# Patient Record
Sex: Male | Born: 1950 | Race: White | Hispanic: No | Marital: Married
Health system: Midwestern US, Academic
[De-identification: ages and names within clinical notes are randomized; demographics above are authoritative.]

---

## 2018-06-27 ENCOUNTER — Encounter: Admit: 2018-06-27 | Discharge: 2018-06-28 | Payer: MEDICARE

## 2018-07-03 ENCOUNTER — Encounter: Admit: 2018-07-03 | Discharge: 2018-07-04 | Payer: MEDICARE

## 2018-07-04 ENCOUNTER — Encounter: Admit: 2018-07-04 | Discharge: 2018-07-04 | Payer: MEDICARE

## 2018-07-04 DIAGNOSIS — R918 Other nonspecific abnormal finding of lung field: Principal | ICD-10-CM

## 2018-07-04 MED ORDER — SODIUM CHLORIDE 0.9 % IV SOLP
INTRAVENOUS | 0 refills | Status: CN
Start: 2018-07-04 — End: ?

## 2018-07-10 ENCOUNTER — Ambulatory Visit: Admit: 2018-07-11 | Discharge: 2018-07-11 | Payer: MEDICARE

## 2018-07-10 DIAGNOSIS — R918 Other nonspecific abnormal finding of lung field: Principal | ICD-10-CM

## 2018-07-11 ENCOUNTER — Encounter: Admit: 2018-07-11 | Discharge: 2018-07-11 | Payer: MEDICARE

## 2018-07-11 ENCOUNTER — Ambulatory Visit: Admit: 2018-07-11 | Discharge: 2018-07-11 | Payer: MEDICARE

## 2018-07-11 ENCOUNTER — Ambulatory Visit: Admit: 2018-07-10 | Discharge: 2018-07-10 | Payer: MEDICARE

## 2018-07-11 DIAGNOSIS — E119 Type 2 diabetes mellitus without complications: ICD-10-CM

## 2018-07-11 DIAGNOSIS — Z87891 Personal history of nicotine dependence: ICD-10-CM

## 2018-07-11 DIAGNOSIS — J8410 Pulmonary fibrosis, unspecified: ICD-10-CM

## 2018-07-11 DIAGNOSIS — R918 Other nonspecific abnormal finding of lung field: Principal | ICD-10-CM

## 2018-07-11 DIAGNOSIS — R590 Localized enlarged lymph nodes: ICD-10-CM

## 2018-07-11 DIAGNOSIS — E039 Hypothyroidism, unspecified: ICD-10-CM

## 2018-07-11 DIAGNOSIS — Z880 Allergy status to penicillin: ICD-10-CM

## 2018-07-11 DIAGNOSIS — R0689 Other abnormalities of breathing: ICD-10-CM

## 2018-07-11 DIAGNOSIS — J329 Chronic sinusitis, unspecified: ICD-10-CM

## 2018-07-11 LAB — GRAM STAIN

## 2018-07-11 LAB — LEUKEMIA/LYMPHOMA PANEL FLUID/TISSUE

## 2018-07-11 LAB — COMPREHENSIVE METABOLIC PANEL
Lab: 1 mg/dL (ref 0.3–1.2)
Lab: 1.1 mg/dL (ref 0.4–1.24)
Lab: 101 mg/dL — ABNORMAL HIGH (ref 70–100)
Lab: 103 MMOL/L (ref 98–110)
Lab: 13 mg/dL (ref 7–25)
Lab: 138 MMOL/L (ref 137–147)
Lab: 25 U/L (ref 7–40)
Lab: 26 U/L (ref 7–56)
Lab: 27 MMOL/L (ref 21–30)
Lab: 3.6 MMOL/L (ref 3.5–5.1)
Lab: 4.5 g/dL (ref 3.5–5.0)
Lab: 67 U/L (ref 25–110)
Lab: 7.8 g/dL (ref 6.0–8.0)
Lab: 8 (ref 3–12)
Lab: 9.4 mg/dL (ref 8.5–10.6)
Lab: >60 mL/min (ref >60)
Lab: >60 mL/min (ref >60)

## 2018-07-11 LAB — CBC: Lab: 7.6 10*3/uL (ref 4.5–11.0)

## 2018-07-11 LAB — HERPES SIMPLEX PCR - NON-BLOOD

## 2018-07-11 MED ORDER — FENTANYL CITRATE (PF) 50 MCG/ML IJ SOLN
25 ug | Freq: Once | INTRAVENOUS | 0 refills | Status: DC | PRN
Start: 2018-07-11 — End: 2018-07-11

## 2018-07-11 MED ORDER — SUGAMMADEX 100 MG/ML IV SOLN
INTRAVENOUS | 0 refills | Status: DC
Start: 2018-07-11 — End: 2018-07-11
  Administered 2018-07-11: 16:00:00 200 mg via INTRAVENOUS

## 2018-07-11 MED ORDER — HYDROMORPHONE (PF) 2 MG/ML IJ SYRG
.5 mg | Freq: Once | INTRAVENOUS | 0 refills | Status: DC | PRN
Start: 2018-07-11 — End: 2018-07-11

## 2018-07-11 MED ORDER — FENTANYL CITRATE (PF) 50 MCG/ML IJ SOLN
50 ug | Freq: Once | INTRAVENOUS | 0 refills | Status: DC | PRN
Start: 2018-07-11 — End: 2018-07-11

## 2018-07-11 MED ORDER — DEXAMETHASONE SODIUM PHOSPHATE 4 MG/ML IJ SOLN
INTRAVENOUS | 0 refills | Status: DC
Start: 2018-07-11 — End: 2018-07-11
  Administered 2018-07-11: 15:00:00 4 mg via INTRAVENOUS

## 2018-07-11 MED ORDER — ONDANSETRON HCL (PF) 4 MG/2 ML IJ SOLN
4 mg | Freq: Once | INTRAVENOUS | 0 refills | Status: DC | PRN
Start: 2018-07-11 — End: 2018-07-11

## 2018-07-11 MED ORDER — SODIUM CHLORIDE 0.9 % IV SOLP
INTRAVENOUS | 0 refills | Status: DC
Start: 2018-07-11 — End: 2018-07-11
  Administered 2018-07-11: 13:00:00 1000 mL via INTRAVENOUS

## 2018-07-11 MED ORDER — LIDOCAINE (PF) 200 MG/10 ML (2 %) IJ SYRG
0 refills | Status: DC
Start: 2018-07-11 — End: 2018-07-11
  Administered 2018-07-11: 15:00:00 100 mg via INTRAVENOUS

## 2018-07-11 MED ORDER — DEXTRAN 70-HYPROMELLOSE (PF) 0.1-0.3 % OP DPET
0 refills | Status: DC
Start: 2018-07-11 — End: 2018-07-11
  Administered 2018-07-11: 15:00:00 1 [drp] via OPHTHALMIC

## 2018-07-11 MED ORDER — HALOPERIDOL LACTATE 5 MG/ML IJ SOLN
1 mg | Freq: Once | INTRAVENOUS | 0 refills | Status: DC | PRN
Start: 2018-07-11 — End: 2018-07-11

## 2018-07-11 MED ORDER — PROPOFOL INJ 10 MG/ML IV VIAL
0 refills | Status: DC
Start: 2018-07-11 — End: 2018-07-11
  Administered 2018-07-11: 15:00:00 140 mg via INTRAVENOUS

## 2018-07-11 MED ORDER — FENTANYL CITRATE (PF) 50 MCG/ML IJ SOLN
0 refills | Status: DC
Start: 2018-07-11 — End: 2018-07-11
  Administered 2018-07-11: 15:00:00 25 ug via INTRAVENOUS

## 2018-07-11 MED ORDER — ROCURONIUM 10 MG/ML IV SOLN
INTRAVENOUS | 0 refills | Status: DC
Start: 2018-07-11 — End: 2018-07-11
  Administered 2018-07-11: 15:00:00 40 mg via INTRAVENOUS
  Administered 2018-07-11 (×2): 10 mg via INTRAVENOUS

## 2018-07-11 MED ORDER — ONDANSETRON HCL (PF) 4 MG/2 ML IJ SOLN
INTRAVENOUS | 0 refills | Status: DC
Start: 2018-07-11 — End: 2018-07-11
  Administered 2018-07-11: 16:00:00 4 mg via INTRAVENOUS

## 2018-07-11 MED ORDER — PROMETHAZINE 25 MG/ML IJ SOLN
6.25 mg | INTRAVENOUS | 0 refills | Status: DC | PRN
Start: 2018-07-11 — End: 2018-07-11

## 2018-07-11 MED ORDER — OXYCODONE-ACETAMINOPHEN 5-325 MG PO TAB
1-2 | Freq: Once | ORAL | 0 refills | Status: DC | PRN
Start: 2018-07-11 — End: 2018-07-11

## 2018-07-12 LAB — CELL COUNT W/DIFF-FLUIDS
Lab: 31 % (ref 0–0.80)
Lab: 45 /uL (ref 0–2)
Lab: 53 % (ref 0–0.45)

## 2018-07-13 ENCOUNTER — Encounter: Admit: 2018-07-13 | Discharge: 2018-07-13 | Payer: MEDICARE

## 2018-07-13 DIAGNOSIS — R0689 Other abnormalities of breathing: ICD-10-CM

## 2018-07-13 DIAGNOSIS — E039 Hypothyroidism, unspecified: Principal | ICD-10-CM

## 2018-07-13 DIAGNOSIS — J329 Chronic sinusitis, unspecified: ICD-10-CM

## 2018-07-13 LAB — CULTURE-RESP,LOWER W/SENSITIVITY: Lab: LOW FL (ref 7–11)

## 2018-07-17 ENCOUNTER — Encounter: Admit: 2018-07-17 | Discharge: 2018-07-17 | Payer: MEDICARE

## 2018-07-18 LAB — CMV QUANT PCR-FLUID

## 2018-08-12 LAB — CULTURE-FUNGAL,OTHER

## 2018-08-14 ENCOUNTER — Encounter: Admit: 2018-08-14 | Discharge: 2018-08-14 | Payer: MEDICARE

## 2018-08-26 LAB — CULTURE-TB (AFB)

## 2019-07-31 ENCOUNTER — Encounter: Admit: 2019-07-31 | Discharge: 2019-07-31 | Payer: MEDICARE

## 2019-08-01 ENCOUNTER — Encounter: Admit: 2019-08-01 | Discharge: 2019-08-01 | Payer: MEDICARE

## 2019-08-01 NOTE — Telephone Encounter
Per Dr Hart Rochester, patient will need to be seen by [facility] urgent and can be seen by [facility] - 2 weeks.  Pulm Admin emailed with request to contact patient and schedule.  Trellis Moment, RN

## 2019-08-01 NOTE — Telephone Encounter
Referral received from Dr Earvin Hansen for RLL lung mass.  CT Chest from 07/25/19.  Will discuss with Dr Charlie Pitter.  Trellis Moment, RN

## 2019-08-15 ENCOUNTER — Encounter: Admit: 2019-08-15 | Discharge: 2019-08-15 | Payer: MEDICARE

## 2019-08-15 ENCOUNTER — Ambulatory Visit: Admit: 2019-08-15 | Discharge: 2019-08-15 | Payer: MEDICARE

## 2019-08-15 DIAGNOSIS — E039 Hypothyroidism, unspecified: Secondary | ICD-10-CM

## 2019-08-15 DIAGNOSIS — J219 Acute bronchiolitis, unspecified: Secondary | ICD-10-CM

## 2019-08-15 DIAGNOSIS — J189 Pneumonia, unspecified organism: Secondary | ICD-10-CM

## 2019-08-15 DIAGNOSIS — J329 Chronic sinusitis, unspecified: Secondary | ICD-10-CM

## 2019-08-15 LAB — COMPREHENSIVE METABOLIC PANEL
Lab: 103 MMOL/L (ref 98–110)
Lab: 13 mg/dL — ABNORMAL LOW (ref 7–25)
Lab: 138 MMOL/L (ref 137–147)
Lab: 20 U/L — ABNORMAL HIGH (ref 7–56)
Lab: 3.9 MMOL/L (ref 3.5–5.1)
Lab: 95 mg/dL (ref 70–100)

## 2019-08-15 LAB — CBC AND DIFF
Lab: 0 K/UL — ABNORMAL HIGH (ref 0–0.20)
Lab: 0.1 K/UL (ref >60)
Lab: 0.3 K/UL — ABNORMAL HIGH (ref >60)
Lab: 4.7 M/UL (ref 4.4–5.5)
Lab: 5.2 10*3/uL (ref 4.5–11.0)

## 2019-08-19 ENCOUNTER — Encounter: Admit: 2019-08-19 | Discharge: 2019-08-19 | Payer: MEDICARE

## 2019-08-19 DIAGNOSIS — J189 Pneumonia, unspecified organism: Secondary | ICD-10-CM

## 2019-08-19 DIAGNOSIS — J329 Chronic sinusitis, unspecified: Secondary | ICD-10-CM

## 2019-08-19 DIAGNOSIS — E039 Hypothyroidism, unspecified: Secondary | ICD-10-CM

## 2019-09-29 ENCOUNTER — Encounter: Admit: 2019-09-29 | Discharge: 2019-09-29 | Payer: MEDICARE

## 2019-10-23 ENCOUNTER — Encounter: Admit: 2019-10-23 | Discharge: 2019-10-23 | Payer: MEDICARE

## 2021-05-25 IMAGING — CT CHEST W(Adult)
2 of 4 series · 12 of 36 positions shown, 15 images · IV contrast (Omnipaque)
Comparison: none

[Series 2: thorax ax 5.00 br40 s3 · axial · 0.59mm/px · z∈[+1652,+1957]mm · 9 of 69 slices shown, 12 images]
[im 6/69  mediastinal]
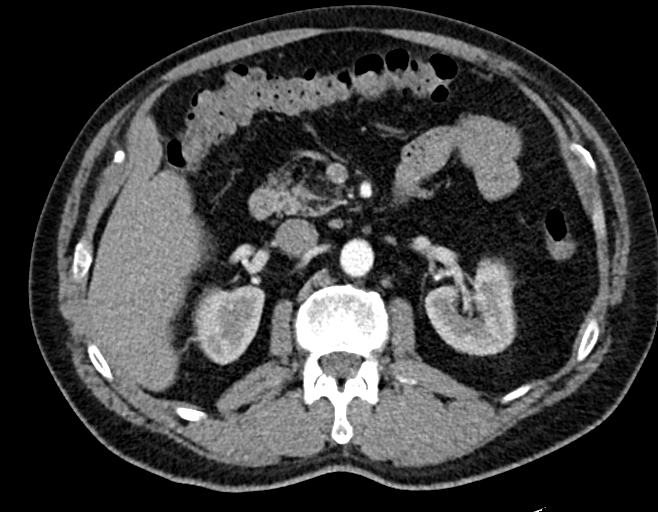
[im 6/69  lung]
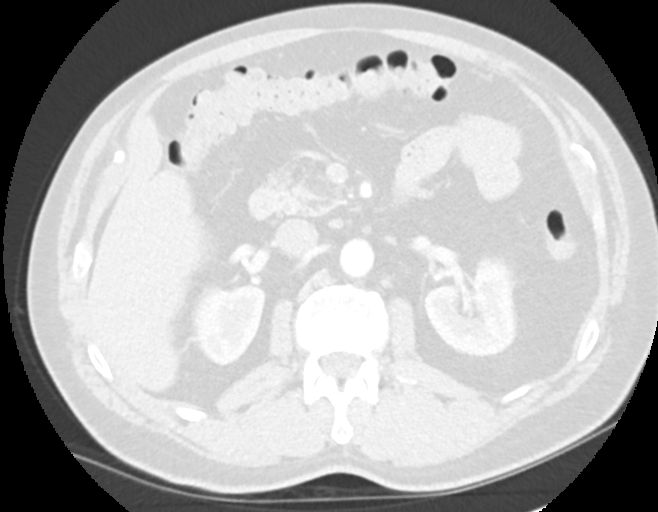
[im 16/69  lung]
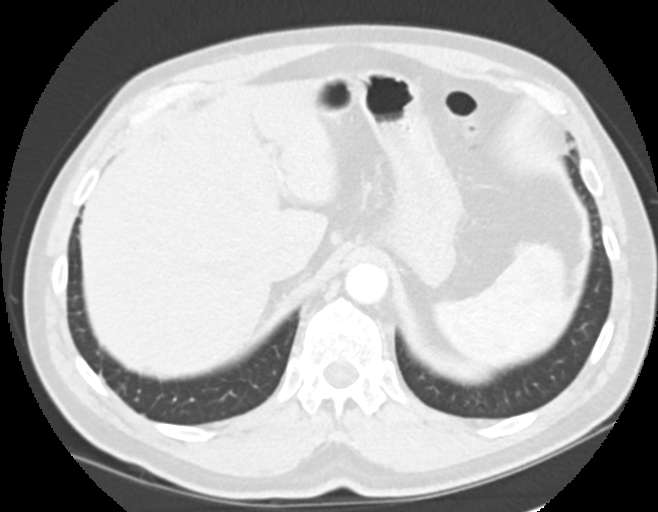
[im 21/69  lung]
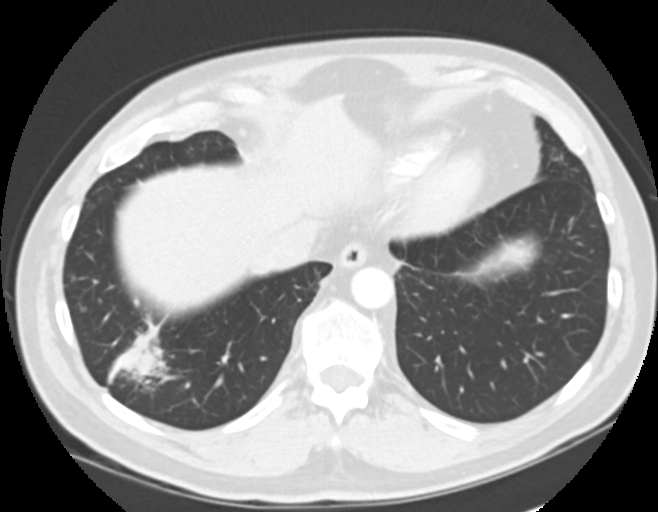
[im 27/69  lung]
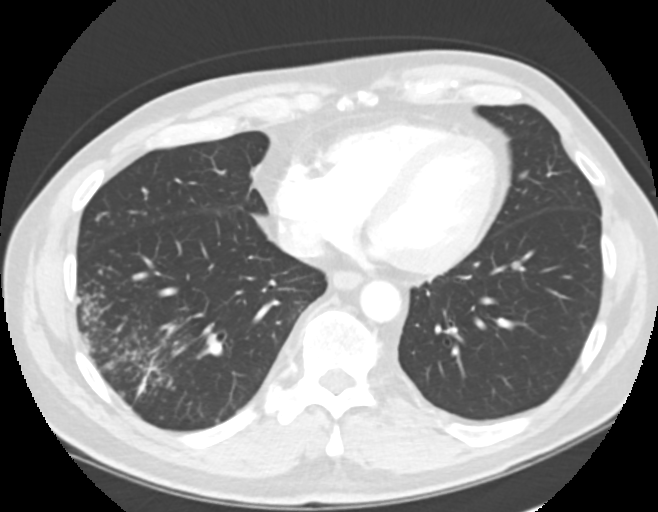
[im 37/69  mediastinal]
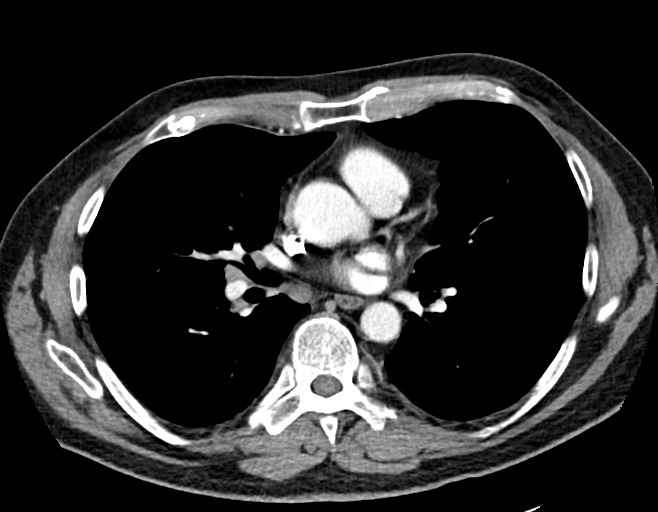
[im 37/69  lung]
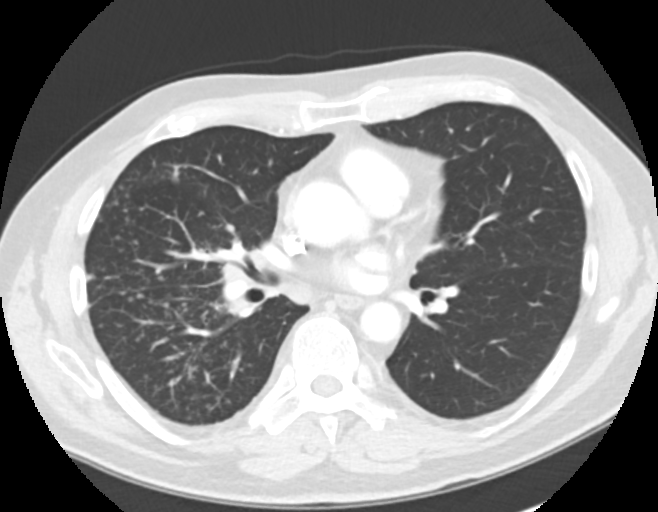
[im 42/69  lung]
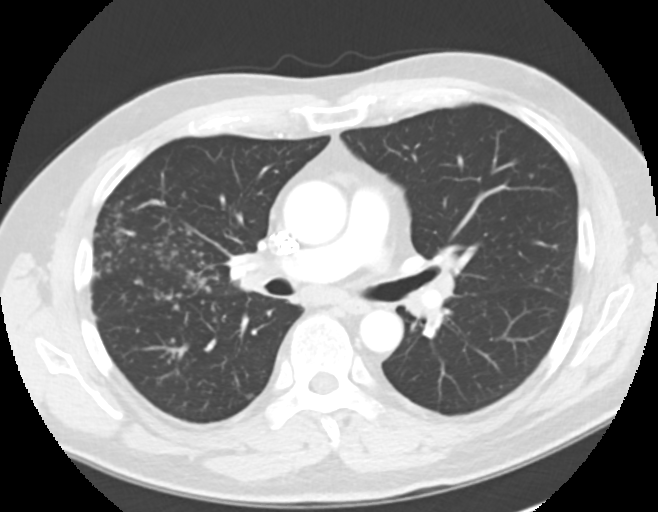
[im 48/69  lung]
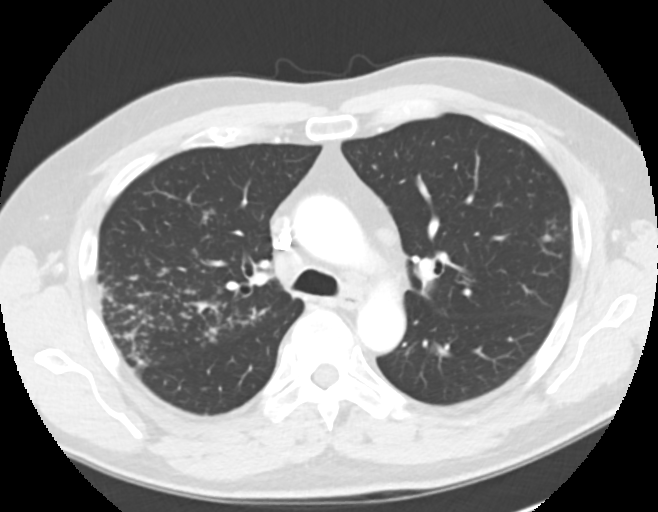
[im 58/69  lung]
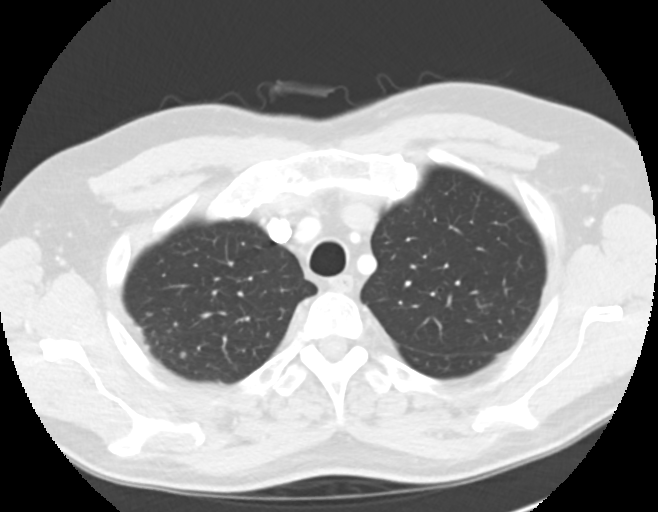
[im 63/69  mediastinal]
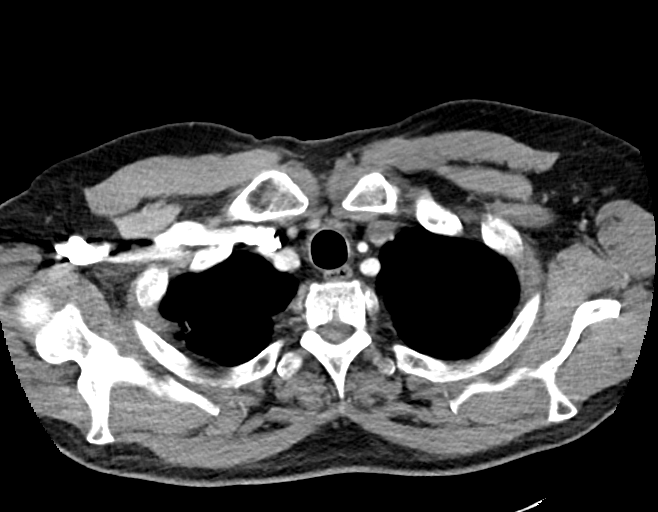
[im 63/69  lung]
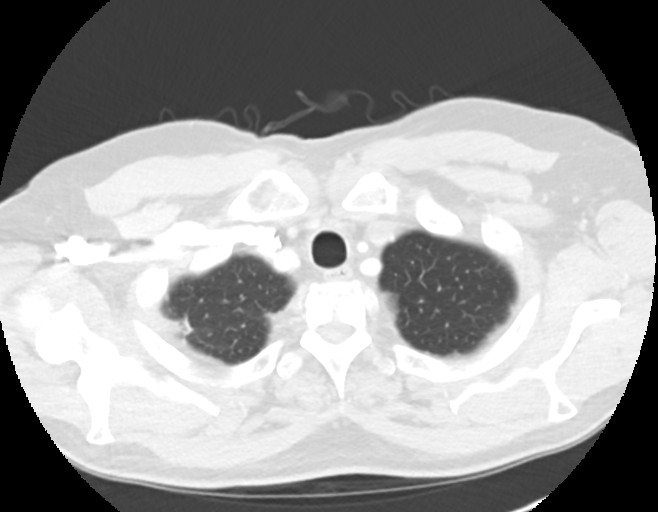

[Series 4: thorax cor 5.00 br40 s3 · coronal · 0.73mm/px · 3 of 57 slices shown]
[im 12/57  lung]
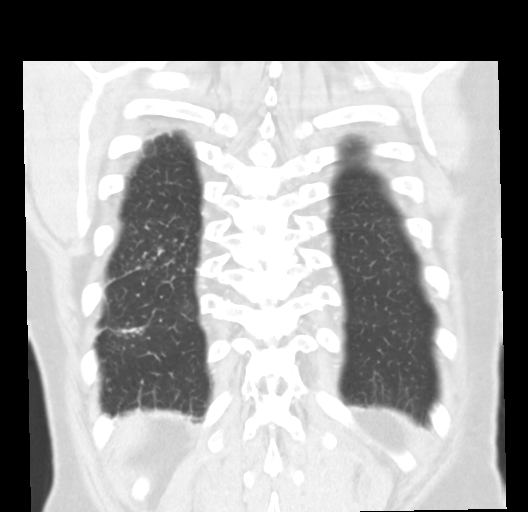
[im 23/57  lung]
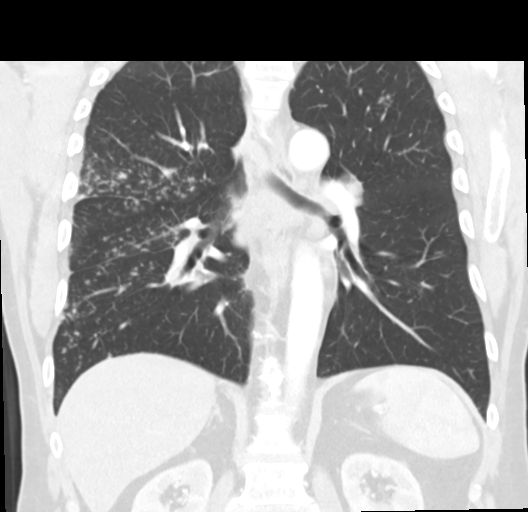
[im 34/57  lung]
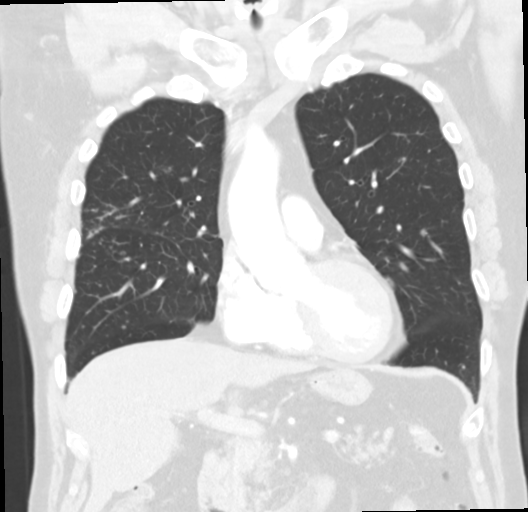

[12 of 36 positions shown; findings below may reference images not displayed]

DIAGNOSTIC STUDIES

EXAM

COMPUTED TOMOGRAPHY, THORAX, WITH CONTRAST MATERIAL CPT 72442

INDICATION

Mass R lower lung field
ROUTINE FOLLOW-UP RT LOWER LOBE MASS. NO CURRENT CHEST COMPLAINTS. C-1.19, 304-JR.2. PT GIVEN 100

TECHNIQUE

Contiguous axial tomographic images were obtained from the lung apices to the upper abdomen with
intravenous contrast.  Coronal and sagittal reformatted images were obtained.

All CT scans at this facility use dose modulation, iterative reconstruction, and/or weight based
dosing when appropriate to reduce radiation dose to as low as reasonably achievable.

COMPARISONS

02/27/2011

FINDINGS

Re-demonstration of a cluster of 3 subcentimeter pretracheal nodes, image 20. Re-demonstration of a
1.6cm right paratracheal node, image 23. Unchanged 1.3 cm subcarinal node. There is a 1.9 cm right
hilar node, slightly increased in size compared to the prior, 1.7 cm. Unchanged 1.3 cm left hilar
node. The heart appears normal in size. No pericardial effusion. Motion artifact at the aortic root.
No central pulmonary embolism. No pleural effusion. Focal cluster of centrilobular nodules within
the superior segment of the left lower lobe and within the left upper lobe, image 28. Unchanged 4
millimeter left upper lobe nodule, image 25. Segmental cluster of micronodules within the right
upper lobe which appears mildly increased, image 26. Unchanged 4 millimeter right upper lobe nodule,
image 16. Unchanged 2.5 x 3.5 cm spiculated right lower lobe mass, image 49. There is an associated
peripheral ground-glass opacity. Mild increase in patchy centrilobular right lower lobe nodules. The
adrenal glands appear grossly unremarkable. Chronic mild anterior wedging of several upper thoracic
vertebral bodies.

IMPRESSION

1. Interval increase in centrilobular micronodules throughout the right lung. Unchanged right lower
lobe mass.

Findings may be secondary to an infectious etiology. Differential diagnosis includes a right lower
lobe neoplasm.

Follow-up with a pulmonary consultation and possible CT-guided biopsy is recommended.

Tech Notes:

ROUTINE FOLLOW-UP RT LOWER LOBE MASS. NO CURRENT CHEST COMPLAINTS. C-1.19, 304-JR.2. PT GIVEN 100 CC
[id number] BY IV. CT/NM 3/0
